# Patient Record
Sex: Female | Born: 1965 | Race: White | Hispanic: No | Marital: Married | Smoking: Never smoker
Health system: Southern US, Community
[De-identification: ages and names within clinical notes are randomized; demographics above are authoritative.]

---

## 2002-11-14 ENCOUNTER — Other Ambulatory Visit: Admission: RE | Admit: 2002-11-14 | Discharge: 2002-11-14 | Payer: Self-pay | Admitting: Obstetrics and Gynecology

## 2005-03-14 ENCOUNTER — Other Ambulatory Visit: Admission: RE | Admit: 2005-03-14 | Discharge: 2005-03-14 | Payer: Self-pay | Admitting: Obstetrics and Gynecology

## 2013-10-04 ENCOUNTER — Ambulatory Visit: Payer: Self-pay | Admitting: Podiatrist

## 2013-10-25 ENCOUNTER — Encounter: Payer: Self-pay | Admitting: Podiatrist

## 2013-10-25 ENCOUNTER — Ambulatory Visit (INDEPENDENT_AMBULATORY_CARE_PROVIDER_SITE_OTHER): Payer: Managed Care, Other (non HMO)

## 2013-10-25 ENCOUNTER — Ambulatory Visit (INDEPENDENT_AMBULATORY_CARE_PROVIDER_SITE_OTHER): Payer: Managed Care, Other (non HMO) | Admitting: Podiatrist

## 2013-10-25 VITALS — BP 105/71 | HR 80 | Resp 18

## 2013-10-25 DIAGNOSIS — M76829 Posterior tibial tendinitis, unspecified leg: Secondary | ICD-10-CM

## 2013-10-25 DIAGNOSIS — R52 Pain, unspecified: Secondary | ICD-10-CM

## 2013-10-25 NOTE — Progress Notes (Signed)
   Subjective:    Patient ID: Brittany Delgado, female    DOB: [birth year], 48 y.o.   MRN: [id number]  HPI MY FEET ARE HURTING AND I RUN ABOUT 2 DAYS A WEEK AND HURT ON THE BALLS OF BOTH FEET AND THEY BURN AND THROB AND I GOT A BRACE TO WEAR AND HURTS IF I DO SOMETHING FOR A LONG PERIOD OF TIME    Review of Systems  All other systems reviewed and are negative.      Objective:   Physical Exam  Patient is awake, alert, and oriented x 3.  In no acute distress.  Vascular status is intact with palpable pedal pulses at 2/4 DP and PT bilateral and capillary refill time within normal limits. Neurological sensation is also intact bilaterally via Semmes Weinstein monofilament at 5/5 sites. Light touch, vibratory sensation, Achilles tendon reflex is intact. Dermatological exam reveals skin color, turger and texture as normal. No open lesions present.  Musculature intact with dorsiflexion, plantarflexion, inversion, eversion.  Forefoot pain is noted bilaterally. Mild tenderness along the posterior tibial tendon and at its insertion is noted.    Assessment & Plan:  Assessment: Posterior tibial tendinitis   Plan: Recommended orthotics to better align and position her foot to keep the posterior tibial tendon from having so much stress put on it. We'll cast her orthotics and will call in ready for pick up..Marland Kitchen

## 2013-10-25 NOTE — Patient Instructions (Signed)
I believe you would benefit from some orthotics to fit in your running shoes to help even out the pressure and biomechanical load on your feet-- we will check to see if inserts are covered by your insurance and will have you casted for the custom orthotics for your feet.

## 2013-11-09 ENCOUNTER — Other Ambulatory Visit: Payer: Managed Care, Other (non HMO)

## 2015-05-17 ENCOUNTER — Encounter: Payer: Self-pay | Admitting: Sports Medicine

## 2015-05-17 ENCOUNTER — Ambulatory Visit (INDEPENDENT_AMBULATORY_CARE_PROVIDER_SITE_OTHER): Payer: Managed Care, Other (non HMO)

## 2015-05-17 ENCOUNTER — Ambulatory Visit (INDEPENDENT_AMBULATORY_CARE_PROVIDER_SITE_OTHER): Payer: Managed Care, Other (non HMO) | Admitting: Sports Medicine

## 2015-05-17 DIAGNOSIS — M722 Plantar fascial fibromatosis: Secondary | ICD-10-CM

## 2015-05-17 DIAGNOSIS — M79673 Pain in unspecified foot: Secondary | ICD-10-CM | POA: Diagnosis not present

## 2015-05-17 MED ORDER — DICLOFENAC SODIUM 75 MG PO TBEC: 75.0000 mg | DELAYED_RELEASE_TABLET | Freq: Two times a day (BID) | ORAL | Status: DC

## 2015-05-17 NOTE — Progress Notes (Signed)
Patient ID: Brittany Delgado, female   DOB: 07-Mar-1966, 50 y.o.   MRN: [id number]   Subjective: Brittany Delgado is a 50 y.o. female patient presents to office with complaint of Bilateral foot pain. Patient  States that her feet hurt all over; her feet generally feel "bruised"; hurts on the bottom often and on the top sometimes; states that she exercises from Sunday through Thursday and typically afterwards, her feet are really sore. States that she hasn't done anything different in her exercise routine; reports that around Christmas time She got new shoes and has been using those and is concerned if her shoes are aggravating her feet. She states that when she does wear Flat shoes the pain feels worse; Admits pain is most intense morning first rising out of bed versus the afternoon; states that she has tried compression sock and ice that helps. Admits that She doesn't take the time to ice like she should even though it really helps a lot with her pain and discomfort.  Denies any other pedal complaints at this time.  There are no active problems to display for this patient.  No current outpatient prescriptions on file prior to visit.   No current facility-administered medications on file prior to visit.   Allergies  Allergen Reactions  . Fexofenadine Other (See Comments)    headaches  . Mirtazapine Other (See Comments)    hypersomnolence  . Paroxetine Other (See Comments)    Insomnia  . Penicillins   . Prednisone Other (See Comments)    Irritability    Objective: Physical Exam General: The patient is alert and oriented x3 in no acute distress.  Dermatology: Skin is warm, dry and supple bilateral lower extremities. Nails 1-10 are normal. There is no erythema, edema, no eccymosis, no open lesions present. Integument is otherwise unremarkable.  Vascular: Dorsalis Pedis pulse and Posterior Tibial pulse are 2/4 bilateral. Capillary fill time is immediate to all digits.  Neurological:  Grossly intact to light touch with an achilles reflex of +2/5 and a negative Tinel's sign bilateral.  Musculoskeletal: Tenderness to palpation at the medial calcaneal tubercale and through the insertion of the plantar fascia and arch on both feet. No pain with compression of calcaneus bilateral. No pain with tuning fork to calcaneus bilateral. No pain with calf compression bilateral. Ankle and pedal joints range of motion within normal limits bilateral. Strength 5/5 in all groups bilateral.   Xray, Right & Left foot: Normal osseous mineralization. Joint spaces preserved. No fracture/dislocation/boney destruction. Mild hammertoe deformity. Left >Right Calcaneal spur present with mild thickening of plantar fascia. No other soft tissue abnormalities or radiopaque foreign bodies.   Assessment and Plan: Problem List Items Addressed This Visit    None    Visit Diagnoses    Foot pain, unspecified laterality    -  Primary    Relevant Medications    diclofenac (VOLTAREN) 75 MG EC tablet    Other Relevant Orders    DG Foot 2 Views Left    DG Foot 2 Views Right    Plantar fasciitis, bilateral        Relevant Medications    diclofenac (VOLTAREN) 75 MG EC tablet       -Complete examination performed. Discussed with patient in detail the condition of plantar fasciitis, how this occurs and general treatment options. Explained both conservative and surgical treatments.  -No injection given due to Patient intolerability to steroids -Rx Diclofenac -Recommended good supportive shoes and advised use of OTC insert. Explained to  patient that if these orthoses work well, we will continue with these. If these do not improve her condition and Pain and she gets relief with fascial brace, we will consider custom molded orthoses. - Explained in detail the use of the fascial brace which was dispensed at today's visit. -Explained and dispensed to patient daily stretching exercises. -Recommend patient to ice affected  area 1-2x daily. -Patient to return to office in 3 weeks for follow up or sooner if problems or questions arise.  Asencion Islam, DPM

## 2015-05-17 NOTE — Patient Instructions (Signed)

## 2015-05-17 NOTE — Progress Notes (Deleted)
   Subjective:    Patient ID: Brittany Delgado, female    DOB: 1965/07/10, 50 y.o.   MRN: [id number]  HPI    Review of Systems  All other systems reviewed and are negative.      Objective:   Physical Exam        Assessment & Plan:

## 2015-06-07 ENCOUNTER — Ambulatory Visit: Payer: Managed Care, Other (non HMO) | Admitting: Sports Medicine

## 2015-09-27 ENCOUNTER — Ambulatory Visit: Payer: Managed Care, Other (non HMO) | Admitting: Sports Medicine

## 2017-01-02 ENCOUNTER — Ambulatory Visit (INDEPENDENT_AMBULATORY_CARE_PROVIDER_SITE_OTHER): Payer: 59 | Admitting: Sports Medicine

## 2017-01-02 DIAGNOSIS — M722 Plantar fascial fibromatosis: Secondary | ICD-10-CM

## 2017-01-02 DIAGNOSIS — M79672 Pain in left foot: Secondary | ICD-10-CM

## 2017-01-02 DIAGNOSIS — M79671 Pain in right foot: Secondary | ICD-10-CM

## 2017-01-02 MED ORDER — DICLOFENAC SODIUM 75 MG PO TBEC: 75.0000 mg | DELAYED_RELEASE_TABLET | Freq: Two times a day (BID) | ORAL | 0 refills | Status: DC

## 2017-01-02 NOTE — Progress Notes (Signed)
Patient ID: Brittany Delgado, female   DOB: 23-Jun-1965, 51 y.o.   MRN: [id number]   Subjective: Brittany Delgado is a 51 y.o. female patient presents to office with complaint of Bilateral foot pain. Patient  States that the pain is the same and that she never got the medicine that I recommended earlier this year. Patient states that pain is approximately 8-9 out of 10. Every day. States that it is difficult to describe the pain generally. Her feet feel bruise and just hurt all over the bottom. States that she's been icing and using over-the-counter insoles changing shoes and has been using night splint with no complete relief. States that icing does help, however, she still consistently has pain with first few steps out of bed in the morning and every evening and sometimes after exercise.  Denies any other pedal complaints at this time.  There are no active problems to display for this patient.  Current Outpatient Prescriptions on File Prior to Visit  Medication Sig Dispense Refill  . albuterol (PROVENTIL) (2.5 MG/3ML) 0.083% nebulizer solution 2.5 mg.    . amitriptyline (ELAVIL) 10 MG tablet     . calcium-vitamin D (CALCIUM 500/D) 500-200 MG-UNIT tablet Take by mouth.    Marland Kitchen amitriptyline (ELAVIL) 25 MG tablet Take 25 mg by mouth.    Marland Kitchen PROAIR HFA 108 (90 Base) MCG/ACT inhaler     . progesterone (PROMETRIUM) 100 MG capsule Take 200 mg by mouth.    . tretinoin (RETIN-A) 0.025 % cream Apply topically.    . tretinoin (RETIN-A) 0.025 % cream     . triamcinolone (KENALOG) 0.025 % cream Apply topically.    . zinc gluconate 50 MG tablet Take 50 mg by mouth.     No current facility-administered medications on file prior to visit.    Allergies  Allergen Reactions  . Fexofenadine Other (See Comments)    headaches  . Mirtazapine Other (See Comments)    hypersomnolence  . Paroxetine Other (See Comments)    Insomnia  . Penicillins   . Prednisone Other (See Comments)    Irritability     Objective: Physical Exam General: The patient is alert and oriented x3 in no acute distress.  Dermatology: Skin is warm, dry and supple bilateral lower extremities. Nails 1-10 are normal. There is no erythema, edema, no eccymosis, no open lesions present. Integument is otherwise unremarkable.  Vascular: Dorsalis Pedis pulse and Posterior Tibial pulse are 2/4 bilateral. Capillary fill time is immediate to all digits.  Neurological: Grossly intact to light touch with an achilles reflex of +2/5 and a negative Tinel's sign bilateral.  Musculoskeletal: Tenderness to palpation at the medial calcaneal tubercale and through the insertion of the plantar fascia and arch on both feet. No pain with compression of calcaneus bilateral. No pain with tuning fork to calcaneus bilateral. No pain with calf compression bilateral. Ankle and pedal joints range of motion within normal limits bilateral. Strength 5/5 in all groups bilateral.   Assessment and Plan: Problem List Items Addressed This Visit    None    Visit Diagnoses    Plantar fasciitis, bilateral    -  Primary   Relevant Medications   diclofenac (VOLTAREN) 75 MG EC tablet   Foot pain, bilateral       Relevant Medications   diclofenac (VOLTAREN) 75 MG EC tablet      -Complete examination performed. Re-Discussed with patient in detail the condition of plantar fasciitis, how this occurs and general treatment options. Explained both conservative  and surgical treatments.  -Rx Diclofenac -Applied plantar fascial strapping bilateral  -Continue with daily stretching exercises. -Continue to ice affected area 1-2x daily. -Patient to return to office in 3 weeks for follow up or sooner if problems or questions arise. Advised patient that if pain is not better next visit will do steroid injections and or consider PT or EPAT  Asencion Islam, DPM

## 2017-01-02 NOTE — Patient Instructions (Signed)

## 2017-01-16 ENCOUNTER — Telehealth: Payer: Self-pay | Admitting: *Deleted

## 2017-01-16 DIAGNOSIS — M79672 Pain in left foot: Secondary | ICD-10-CM

## 2017-01-16 DIAGNOSIS — M79671 Pain in right foot: Secondary | ICD-10-CM

## 2017-01-16 DIAGNOSIS — M722 Plantar fascial fibromatosis: Secondary | ICD-10-CM

## 2017-01-16 MED ORDER — DICLOFENAC SODIUM 75 MG PO TBEC: 75.0000 mg | DELAYED_RELEASE_TABLET | Freq: Two times a day (BID) | ORAL | 0 refills | Status: DC

## 2017-01-16 NOTE — Telephone Encounter (Signed)
Refill request diclofenac. Dr. Marylene LandStover ordered refill as previously +0additional.

## 2017-01-23 ENCOUNTER — Ambulatory Visit: Payer: 59 | Admitting: Sports Medicine

## 2017-02-06 ENCOUNTER — Telehealth: Payer: Self-pay | Admitting: *Deleted

## 2017-02-06 NOTE — Telephone Encounter (Signed)
Refill request for diclofenac. Dr. Marylene LandStover states pt needs a follow up appt prior to future refills. Return fax denying.

## 2017-04-08 ENCOUNTER — Ambulatory Visit: Payer: 59 | Admitting: Sports Medicine

## 2017-04-17 ENCOUNTER — Encounter: Payer: Self-pay | Admitting: Sports Medicine

## 2017-04-17 ENCOUNTER — Ambulatory Visit: Payer: 59 | Admitting: Sports Medicine

## 2017-04-17 DIAGNOSIS — M79672 Pain in left foot: Secondary | ICD-10-CM

## 2017-04-17 DIAGNOSIS — M79671 Pain in right foot: Secondary | ICD-10-CM

## 2017-04-17 DIAGNOSIS — M722 Plantar fascial fibromatosis: Secondary | ICD-10-CM

## 2017-04-17 MED ORDER — DICLOFENAC SODIUM 75 MG PO TBEC: 75.0000 mg | DELAYED_RELEASE_TABLET | Freq: Two times a day (BID) | ORAL | 0 refills | Status: DC

## 2017-04-17 NOTE — Progress Notes (Signed)
Patient ID: Brittany Delgado, female   DOB: May 06, 1965, 52 y.o.   MRN: [id number]   Subjective: Brittany Delgado is a 52 y.o. female patient presents to office with complaint of Bilateral foot pain. Patient  States that the pain is the still there her feet still feel bruise dand just hurt all over the bottom. States that she's been icing and using over-the-counter insoles changing shoes and has been using night splint with no complete relief, states that sometimes she forgets to stretch but regardless has pain every day. Denies any other pedal complaints at this time.  There are no active problems to display for this patient.  Current Outpatient Medications on File Prior to Visit  Medication Sig Dispense Refill  . albuterol (PROVENTIL) (2.5 MG/3ML) 0.083% nebulizer solution 2.5 mg.    . amitriptyline (ELAVIL) 10 MG tablet     . amitriptyline (ELAVIL) 25 MG tablet Take 25 mg by mouth.    . calcium-vitamin D (CALCIUM 500/D) 500-200 MG-UNIT tablet Take by mouth.    . Estradiol (ELESTRIN) 0.52 MG/0.87 GM (0.06%) GEL     . PROAIR HFA 108 (90 Base) MCG/ACT inhaler     . progesterone (PROMETRIUM) 100 MG capsule Take 200 mg by mouth.    . tretinoin (RETIN-A) 0.025 % cream Apply topically.    . tretinoin (RETIN-A) 0.025 % cream     . triamcinolone (KENALOG) 0.025 % cream Apply topically.    . zinc gluconate 50 MG tablet Take 50 mg by mouth.     No current facility-administered medications on file prior to visit.    Allergies  Allergen Reactions  . Fexofenadine Other (See Comments)    headaches  . Mirtazapine Other (See Comments)    hypersomnolence  . Paroxetine Other (See Comments)    Insomnia  . Penicillins   . Prednisone Other (See Comments)    Irritability    Objective: Physical Exam General: The patient is alert and oriented x3 in no acute distress.  Dermatology: Skin is warm, dry and supple bilateral lower extremities. Nails 1-10 are normal. There is no erythema, edema, no  eccymosis, no open lesions present. Integument is otherwise unremarkable.  Vascular: Dorsalis Pedis pulse and Posterior Tibial pulse are 2/4 bilateral. Capillary fill time is immediate to all digits.  Neurological: Grossly intact to light touch with an achilles reflex of +2/5 and a negative Tinel's sign bilateral.  Musculoskeletal: Tenderness to palpation at the medial calcaneal tubercale and through the insertion of the plantar fascia and arch on both feet. No pain with compression of calcaneus bilateral. No pain with tuning fork to calcaneus bilateral. No pain with calf compression bilateral. Ankle and pedal joints range of motion within normal limits bilateral. Strength 5/5 in all groups bilateral.   Assessment and Plan: Problem List Items Addressed This Visit    None    Visit Diagnoses    Plantar fasciitis, bilateral    -  Primary   Relevant Medications   diclofenac (VOLTAREN) 75 MG EC tablet   Foot pain, bilateral       Relevant Medications   diclofenac (VOLTAREN) 75 MG EC tablet     -Complete examination performed. Re-Discussed with patient in detail the condition of plantar fasciitis, how this occurs and general treatment options. Explained both conservative and surgical treatments.  After oral consent and aseptic prep, injected a mixture containing 1 ml of 2%  plain lidocaine, 1 ml 0.5% plain marcaine, 0.5 ml of kenalog 10 and 0.5 ml of dexamethasone phosphate into  right and left heel without complication. Post-injection care discussed with patient.  -Refilled Diclofenac  -Continue with daily stretching exercises. -Continue to ice affected area 1-2x daily. -Patient to return to office in 4 weeks for follow up or sooner if problems or questions arise.   Asencion Islamitorya Kaelen Brennan, DPM

## 2017-05-15 ENCOUNTER — Ambulatory Visit: Payer: 59 | Admitting: Sports Medicine

## 2018-01-08 ENCOUNTER — Ambulatory Visit: Payer: 59 | Admitting: Sports Medicine

## 2018-02-24 ENCOUNTER — Ambulatory Visit: Payer: 59 | Admitting: Sports Medicine

## 2018-02-24 ENCOUNTER — Encounter: Payer: Self-pay | Admitting: Sports Medicine

## 2018-02-24 DIAGNOSIS — M79672 Pain in left foot: Secondary | ICD-10-CM | POA: Diagnosis not present

## 2018-02-24 DIAGNOSIS — M722 Plantar fascial fibromatosis: Secondary | ICD-10-CM | POA: Diagnosis not present

## 2018-02-24 DIAGNOSIS — M79671 Pain in right foot: Secondary | ICD-10-CM

## 2018-02-24 NOTE — Progress Notes (Signed)
Patient ID: Jamayia Croker, female   DOB: [birth year], 52 y.o.   MRN: [id number]   Subjective: Ece Cumberland is a 52 y.o. female patient presents to office with complaint of Bilateral foot pain. Patient states that the pain is the still there all over the bottoms of both feet especially at heels. Reports pain that radiates to arch and ball as well, Nothing has helped long term and does not recall how much relief that she got from past injections, pain 10/10, + constant pain all the time. Denies any other pedal complaints at this time.  There are no active problems to display for this patient.  Current Outpatient Medications on File Prior to Visit  Medication Sig Dispense Refill  . albuterol (PROVENTIL) (2.5 MG/3ML) 0.083% nebulizer solution 2.5 mg.    . amitriptyline (ELAVIL) 10 MG tablet     . amitriptyline (ELAVIL) 25 MG tablet Take 25 mg by mouth.    . calcium-vitamin D (CALCIUM 500/D) 500-200 MG-UNIT tablet Take by mouth.    . Estradiol (ELESTRIN) 0.52 MG/0.87 GM (0.06%) GEL     . PROAIR HFA 108 (90 Base) MCG/ACT inhaler     . progesterone (PROMETRIUM) 100 MG capsule Take 200 mg by mouth.    . tretinoin (RETIN-A) 0.025 % cream Apply topically.    . tretinoin (RETIN-A) 0.025 % cream     . triamcinolone (KENALOG) 0.025 % cream Apply topically.    . zinc gluconate 50 MG tablet Take 50 mg by mouth.     No current facility-administered medications on file prior to visit.    Allergies  Allergen Reactions  . Fexofenadine Other (See Comments)    headaches  . Mirtazapine Other (See Comments)    hypersomnolence  . Paroxetine Other (See Comments)    Insomnia  . Penicillins   . Prednisone Other (See Comments)    Irritability  . Sulfamethoxazole Hives    Objective: Physical Exam General: The patient is alert and oriented x3 in no acute distress.  Dermatology: Skin is warm, dry and supple bilateral lower extremities. Nails 1-10 are normal. There is no erythema, edema, no eccymosis,  no open lesions present. Integument is otherwise unremarkable.  Vascular: Dorsalis Pedis pulse and Posterior Tibial pulse are 2/4 bilateral. Capillary fill time is immediate to all digits.  Neurological: Grossly intact to light touch with an achilles reflex of +2/5 and a negative Tinel's sign bilateral.  Musculoskeletal: Tenderness to palpation at the medial calcaneal tubercale and through the insertion of the plantar fascia> arch>ball on both feet. No pain with compression of calcaneus bilateral. No pain with tuning fork to calcaneus bilateral. No pain with calf compression bilateral. Ankle and pedal joints range of motion within normal limits bilateral. Strength 5/5 in all groups bilateral.   Assessment and Plan: Problem List Items Addressed This Visit    None    Visit Diagnoses    Plantar fasciitis, bilateral  (Chronic)   -  Primary   Foot pain, bilateral         -Complete examination performed. Re-Discussed with patient in detail the condition of plantar fasciitis, how this occurs and general treatment options. Explained both conservative and surgical treatments.  -Patient elects at this time to try EPAT -Continue with daily stretching exercises. -Continue to ice affected area 1-2x daily and OTC aleve until time for EPAT -Patient to return to office after EPAT or sooner if worsens. After 1st EPAT session Shanda Bumps Q if no better please order Arthritic to further evaluate any other  underlying causes of chronic inflammation.    Asencion Islamitorya Cambryn Charters, DPM

## 2018-03-03 ENCOUNTER — Telehealth: Payer: Self-pay | Admitting: *Deleted

## 2018-03-03 ENCOUNTER — Other Ambulatory Visit: Payer: Self-pay | Admitting: Sports Medicine

## 2018-03-03 DIAGNOSIS — M2550 Pain in unspecified joint: Secondary | ICD-10-CM

## 2018-03-03 NOTE — Progress Notes (Signed)
Arthritic panel order mailed to patient. Patient to go to [facility] to have this panel completed and [facility]. Patient to call for appointment for PT. -Dr. Marylene LandStover

## 2018-03-03 NOTE — Telephone Encounter (Signed)
Patient called and has canceled the EPAT has decided to have you set up PT and would also like to do blood work to see if she has arthritis.

## 2018-03-09 ENCOUNTER — Other Ambulatory Visit: Payer: 59

## 2018-04-21 DIAGNOSIS — M722 Plantar fascial fibromatosis: Secondary | ICD-10-CM | POA: Diagnosis not present

## 2018-04-21 DIAGNOSIS — M25672 Stiffness of left ankle, not elsewhere classified: Secondary | ICD-10-CM | POA: Diagnosis not present

## 2018-04-21 DIAGNOSIS — M799 Soft tissue disorder, unspecified: Secondary | ICD-10-CM | POA: Diagnosis not present

## 2018-04-21 DIAGNOSIS — M25671 Stiffness of right ankle, not elsewhere classified: Secondary | ICD-10-CM | POA: Diagnosis not present

## 2018-04-28 DIAGNOSIS — M799 Soft tissue disorder, unspecified: Secondary | ICD-10-CM | POA: Diagnosis not present

## 2018-04-28 DIAGNOSIS — M25672 Stiffness of left ankle, not elsewhere classified: Secondary | ICD-10-CM | POA: Diagnosis not present

## 2018-04-28 DIAGNOSIS — M722 Plantar fascial fibromatosis: Secondary | ICD-10-CM | POA: Diagnosis not present

## 2018-04-28 DIAGNOSIS — M25671 Stiffness of right ankle, not elsewhere classified: Secondary | ICD-10-CM | POA: Diagnosis not present

## 2018-04-29 DIAGNOSIS — M799 Soft tissue disorder, unspecified: Secondary | ICD-10-CM | POA: Diagnosis not present

## 2018-04-29 DIAGNOSIS — M25672 Stiffness of left ankle, not elsewhere classified: Secondary | ICD-10-CM | POA: Diagnosis not present

## 2018-04-29 DIAGNOSIS — M25671 Stiffness of right ankle, not elsewhere classified: Secondary | ICD-10-CM | POA: Diagnosis not present

## 2018-04-29 DIAGNOSIS — M722 Plantar fascial fibromatosis: Secondary | ICD-10-CM | POA: Diagnosis not present

## 2018-05-04 DIAGNOSIS — M25672 Stiffness of left ankle, not elsewhere classified: Secondary | ICD-10-CM | POA: Diagnosis not present

## 2018-05-04 DIAGNOSIS — M799 Soft tissue disorder, unspecified: Secondary | ICD-10-CM | POA: Diagnosis not present

## 2018-05-04 DIAGNOSIS — M722 Plantar fascial fibromatosis: Secondary | ICD-10-CM | POA: Diagnosis not present

## 2018-05-04 DIAGNOSIS — M25671 Stiffness of right ankle, not elsewhere classified: Secondary | ICD-10-CM | POA: Diagnosis not present

## 2018-05-06 DIAGNOSIS — M799 Soft tissue disorder, unspecified: Secondary | ICD-10-CM | POA: Diagnosis not present

## 2018-05-06 DIAGNOSIS — M25671 Stiffness of right ankle, not elsewhere classified: Secondary | ICD-10-CM | POA: Diagnosis not present

## 2018-05-06 DIAGNOSIS — M25672 Stiffness of left ankle, not elsewhere classified: Secondary | ICD-10-CM | POA: Diagnosis not present

## 2018-05-06 DIAGNOSIS — M722 Plantar fascial fibromatosis: Secondary | ICD-10-CM | POA: Diagnosis not present

## 2018-05-13 DIAGNOSIS — M799 Soft tissue disorder, unspecified: Secondary | ICD-10-CM | POA: Diagnosis not present

## 2018-05-13 DIAGNOSIS — M25672 Stiffness of left ankle, not elsewhere classified: Secondary | ICD-10-CM | POA: Diagnosis not present

## 2018-05-13 DIAGNOSIS — M722 Plantar fascial fibromatosis: Secondary | ICD-10-CM | POA: Diagnosis not present

## 2018-05-13 DIAGNOSIS — M25671 Stiffness of right ankle, not elsewhere classified: Secondary | ICD-10-CM | POA: Diagnosis not present

## 2018-05-20 DIAGNOSIS — M25672 Stiffness of left ankle, not elsewhere classified: Secondary | ICD-10-CM | POA: Diagnosis not present

## 2018-05-20 DIAGNOSIS — M722 Plantar fascial fibromatosis: Secondary | ICD-10-CM | POA: Diagnosis not present

## 2018-05-20 DIAGNOSIS — M25671 Stiffness of right ankle, not elsewhere classified: Secondary | ICD-10-CM | POA: Diagnosis not present

## 2018-05-20 DIAGNOSIS — M799 Soft tissue disorder, unspecified: Secondary | ICD-10-CM | POA: Diagnosis not present

## 2018-06-10 DIAGNOSIS — M799 Soft tissue disorder, unspecified: Secondary | ICD-10-CM | POA: Diagnosis not present

## 2018-06-10 DIAGNOSIS — M722 Plantar fascial fibromatosis: Secondary | ICD-10-CM | POA: Diagnosis not present

## 2018-06-10 DIAGNOSIS — M25671 Stiffness of right ankle, not elsewhere classified: Secondary | ICD-10-CM | POA: Diagnosis not present

## 2018-06-10 DIAGNOSIS — M25672 Stiffness of left ankle, not elsewhere classified: Secondary | ICD-10-CM | POA: Diagnosis not present

## 2019-03-08 DIAGNOSIS — E559 Vitamin D deficiency, unspecified: Secondary | ICD-10-CM | POA: Diagnosis not present

## 2019-03-08 DIAGNOSIS — Z0000 Encounter for general adult medical examination without abnormal findings: Secondary | ICD-10-CM | POA: Diagnosis not present

## 2019-03-08 DIAGNOSIS — Z1211 Encounter for screening for malignant neoplasm of colon: Secondary | ICD-10-CM | POA: Diagnosis not present

## 2019-03-08 DIAGNOSIS — Z131 Encounter for screening for diabetes mellitus: Secondary | ICD-10-CM | POA: Diagnosis not present

## 2019-03-08 DIAGNOSIS — Z1331 Encounter for screening for depression: Secondary | ICD-10-CM | POA: Diagnosis not present

## 2019-03-08 DIAGNOSIS — Z13220 Encounter for screening for lipoid disorders: Secondary | ICD-10-CM | POA: Diagnosis not present

## 2019-05-12 DIAGNOSIS — Z6821 Body mass index (BMI) 21.0-21.9, adult: Secondary | ICD-10-CM | POA: Diagnosis not present

## 2019-05-12 DIAGNOSIS — Z1231 Encounter for screening mammogram for malignant neoplasm of breast: Secondary | ICD-10-CM | POA: Diagnosis not present

## 2019-05-12 DIAGNOSIS — Z01419 Encounter for gynecological examination (general) (routine) without abnormal findings: Secondary | ICD-10-CM | POA: Diagnosis not present

## 2019-09-27 DIAGNOSIS — L709 Acne, unspecified: Secondary | ICD-10-CM | POA: Diagnosis not present

## 2019-09-27 DIAGNOSIS — E559 Vitamin D deficiency, unspecified: Secondary | ICD-10-CM | POA: Diagnosis not present

## 2019-09-27 DIAGNOSIS — F439 Reaction to severe stress, unspecified: Secondary | ICD-10-CM | POA: Diagnosis not present

## 2019-09-27 DIAGNOSIS — L659 Nonscarring hair loss, unspecified: Secondary | ICD-10-CM | POA: Diagnosis not present

## 2019-11-16 DIAGNOSIS — L650 Telogen effluvium: Secondary | ICD-10-CM | POA: Diagnosis not present

## 2020-02-17 DIAGNOSIS — L650 Telogen effluvium: Secondary | ICD-10-CM | POA: Diagnosis not present

## 2020-05-15 DIAGNOSIS — E559 Vitamin D deficiency, unspecified: Secondary | ICD-10-CM | POA: Diagnosis not present

## 2020-05-15 DIAGNOSIS — Z1211 Encounter for screening for malignant neoplasm of colon: Secondary | ICD-10-CM | POA: Diagnosis not present

## 2020-05-15 DIAGNOSIS — Z131 Encounter for screening for diabetes mellitus: Secondary | ICD-10-CM | POA: Diagnosis not present

## 2020-05-15 DIAGNOSIS — Z0000 Encounter for general adult medical examination without abnormal findings: Secondary | ICD-10-CM | POA: Diagnosis not present

## 2020-06-15 DIAGNOSIS — Z1231 Encounter for screening mammogram for malignant neoplasm of breast: Secondary | ICD-10-CM | POA: Diagnosis not present

## 2020-06-15 DIAGNOSIS — Z13820 Encounter for screening for osteoporosis: Secondary | ICD-10-CM | POA: Diagnosis not present

## 2020-06-15 DIAGNOSIS — Z01419 Encounter for gynecological examination (general) (routine) without abnormal findings: Secondary | ICD-10-CM | POA: Diagnosis not present

## 2020-06-15 DIAGNOSIS — Z6821 Body mass index (BMI) 21.0-21.9, adult: Secondary | ICD-10-CM | POA: Diagnosis not present

## 2020-07-12 DIAGNOSIS — M8580 Other specified disorders of bone density and structure, unspecified site: Secondary | ICD-10-CM | POA: Diagnosis not present

## 2020-09-07 DIAGNOSIS — J4530 Mild persistent asthma, uncomplicated: Secondary | ICD-10-CM | POA: Diagnosis not present

## 2020-09-07 DIAGNOSIS — Z6821 Body mass index (BMI) 21.0-21.9, adult: Secondary | ICD-10-CM | POA: Diagnosis not present

## 2020-09-07 DIAGNOSIS — F330 Major depressive disorder, recurrent, mild: Secondary | ICD-10-CM | POA: Diagnosis not present

## 2020-09-07 DIAGNOSIS — Z7689 Persons encountering health services in other specified circumstances: Secondary | ICD-10-CM | POA: Diagnosis not present

## 2020-10-19 DIAGNOSIS — Z1211 Encounter for screening for malignant neoplasm of colon: Secondary | ICD-10-CM | POA: Diagnosis not present

## 2021-01-10 DIAGNOSIS — Z6821 Body mass index (BMI) 21.0-21.9, adult: Secondary | ICD-10-CM | POA: Diagnosis not present

## 2021-01-10 DIAGNOSIS — M7701 Medial epicondylitis, right elbow: Secondary | ICD-10-CM | POA: Diagnosis not present

## 2021-02-18 DIAGNOSIS — Z13220 Encounter for screening for lipoid disorders: Secondary | ICD-10-CM | POA: Diagnosis not present

## 2021-02-18 DIAGNOSIS — M7701 Medial epicondylitis, right elbow: Secondary | ICD-10-CM | POA: Diagnosis not present

## 2021-02-18 DIAGNOSIS — R5383 Other fatigue: Secondary | ICD-10-CM | POA: Diagnosis not present

## 2021-07-15 DIAGNOSIS — J4530 Mild persistent asthma, uncomplicated: Secondary | ICD-10-CM | POA: Diagnosis not present

## 2021-07-15 DIAGNOSIS — R1013 Epigastric pain: Secondary | ICD-10-CM | POA: Diagnosis not present

## 2021-07-15 DIAGNOSIS — Z6821 Body mass index (BMI) 21.0-21.9, adult: Secondary | ICD-10-CM | POA: Diagnosis not present

## 2021-07-15 DIAGNOSIS — F330 Major depressive disorder, recurrent, mild: Secondary | ICD-10-CM | POA: Diagnosis not present

## 2021-07-16 DIAGNOSIS — R1013 Epigastric pain: Secondary | ICD-10-CM | POA: Diagnosis not present

## 2021-07-18 DIAGNOSIS — R1011 Right upper quadrant pain: Secondary | ICD-10-CM | POA: Diagnosis not present

## 2021-07-18 DIAGNOSIS — R1013 Epigastric pain: Secondary | ICD-10-CM | POA: Diagnosis not present

## 2021-08-16 DIAGNOSIS — N951 Menopausal and female climacteric states: Secondary | ICD-10-CM | POA: Diagnosis not present

## 2021-08-16 DIAGNOSIS — Z6822 Body mass index (BMI) 22.0-22.9, adult: Secondary | ICD-10-CM | POA: Diagnosis not present

## 2021-08-16 DIAGNOSIS — G4700 Insomnia, unspecified: Secondary | ICD-10-CM | POA: Diagnosis not present

## 2021-08-16 DIAGNOSIS — Z01419 Encounter for gynecological examination (general) (routine) without abnormal findings: Secondary | ICD-10-CM | POA: Diagnosis not present

## 2021-08-16 DIAGNOSIS — Z1231 Encounter for screening mammogram for malignant neoplasm of breast: Secondary | ICD-10-CM | POA: Diagnosis not present

## 2021-08-19 ENCOUNTER — Other Ambulatory Visit: Payer: Self-pay | Admitting: Obstetrics and Gynecology

## 2021-08-19 DIAGNOSIS — R928 Other abnormal and inconclusive findings on diagnostic imaging of breast: Secondary | ICD-10-CM

## 2021-08-30 ENCOUNTER — Other Ambulatory Visit: Payer: Self-pay

## 2021-09-02 ENCOUNTER — Ambulatory Visit
Admission: RE | Admit: 2021-09-02 | Discharge: 2021-09-02 | Disposition: A | Discharge status: Home / Self Care | Payer: BC Managed Care – PPO | Source: Ambulatory Visit | Attending: Obstetrics and Gynecology | Admitting: Obstetrics and Gynecology

## 2021-09-02 ENCOUNTER — Ambulatory Visit
Admission: RE | Admit: 2021-09-02 | Discharge: 2021-09-02 | Disposition: A | Discharge status: Home / Self Care | Payer: Self-pay | Source: Ambulatory Visit | Attending: Obstetrics and Gynecology | Admitting: Obstetrics and Gynecology

## 2021-09-02 DIAGNOSIS — R928 Other abnormal and inconclusive findings on diagnostic imaging of breast: Secondary | ICD-10-CM

## 2021-09-02 DIAGNOSIS — N6312 Unspecified lump in the right breast, upper inner quadrant: Secondary | ICD-10-CM | POA: Diagnosis not present

## 2021-10-29 DIAGNOSIS — S39012A Strain of muscle, fascia and tendon of lower back, initial encounter: Secondary | ICD-10-CM | POA: Diagnosis not present

## 2021-10-29 DIAGNOSIS — Z6821 Body mass index (BMI) 21.0-21.9, adult: Secondary | ICD-10-CM | POA: Diagnosis not present

## 2022-03-11 DIAGNOSIS — F411 Generalized anxiety disorder: Secondary | ICD-10-CM | POA: Diagnosis not present

## 2022-03-11 DIAGNOSIS — Z780 Asymptomatic menopausal state: Secondary | ICD-10-CM | POA: Diagnosis not present

## 2022-03-11 DIAGNOSIS — Z7689 Persons encountering health services in other specified circumstances: Secondary | ICD-10-CM | POA: Diagnosis not present

## 2022-03-11 DIAGNOSIS — F3342 Major depressive disorder, recurrent, in full remission: Secondary | ICD-10-CM | POA: Diagnosis not present

## 2022-03-11 DIAGNOSIS — J4520 Mild intermittent asthma, uncomplicated: Secondary | ICD-10-CM | POA: Diagnosis not present

## 2022-03-11 DIAGNOSIS — E7800 Pure hypercholesterolemia, unspecified: Secondary | ICD-10-CM | POA: Diagnosis not present

## 2022-09-05 DIAGNOSIS — Z1231 Encounter for screening mammogram for malignant neoplasm of breast: Secondary | ICD-10-CM | POA: Diagnosis not present

## 2022-09-05 DIAGNOSIS — Z6821 Body mass index (BMI) 21.0-21.9, adult: Secondary | ICD-10-CM | POA: Diagnosis not present

## 2022-09-05 DIAGNOSIS — Z01419 Encounter for gynecological examination (general) (routine) without abnormal findings: Secondary | ICD-10-CM | POA: Diagnosis not present

## 2022-09-08 DIAGNOSIS — F411 Generalized anxiety disorder: Secondary | ICD-10-CM | POA: Diagnosis not present

## 2022-09-08 DIAGNOSIS — F5101 Primary insomnia: Secondary | ICD-10-CM | POA: Diagnosis not present

## 2023-08-01 IMAGING — MG MM DIGITAL DIAGNOSTIC UNILAT*R* W/ TOMO W/ CAD
4 series · 4 of 12 positions shown · non-contrast
Comparison: Previous exam(s).

CLINICAL DATA: Patient was recalled from screening mammogram for
possible asymmetry in the right breast.

EXAM:
DIGITAL DIAGNOSTIC UNILATERAL RIGHT MAMMOGRAM WITH TOMOSYNTHESIS AND
CAD; ULTRASOUND RIGHT BREAST LIMITED
TECHNIQUE: Right digital diagnostic mammography and breast tomosynthesis was
performed. The images were evaluated with computer-aided detection.;
Targeted ultrasound examination of the right breast was performed

[R MLO synth-2D]
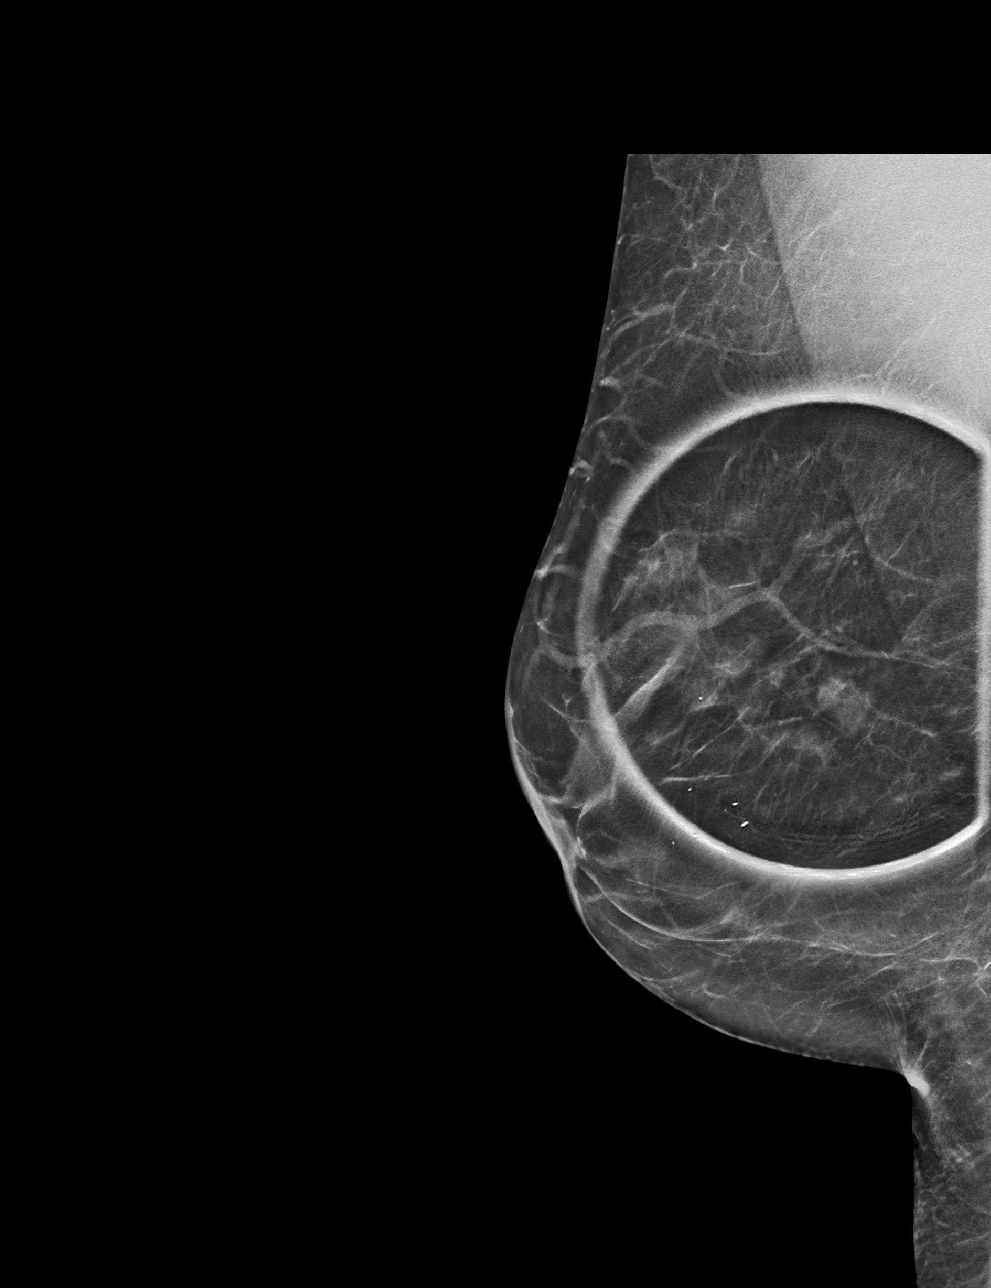

[R CC synth-2D]
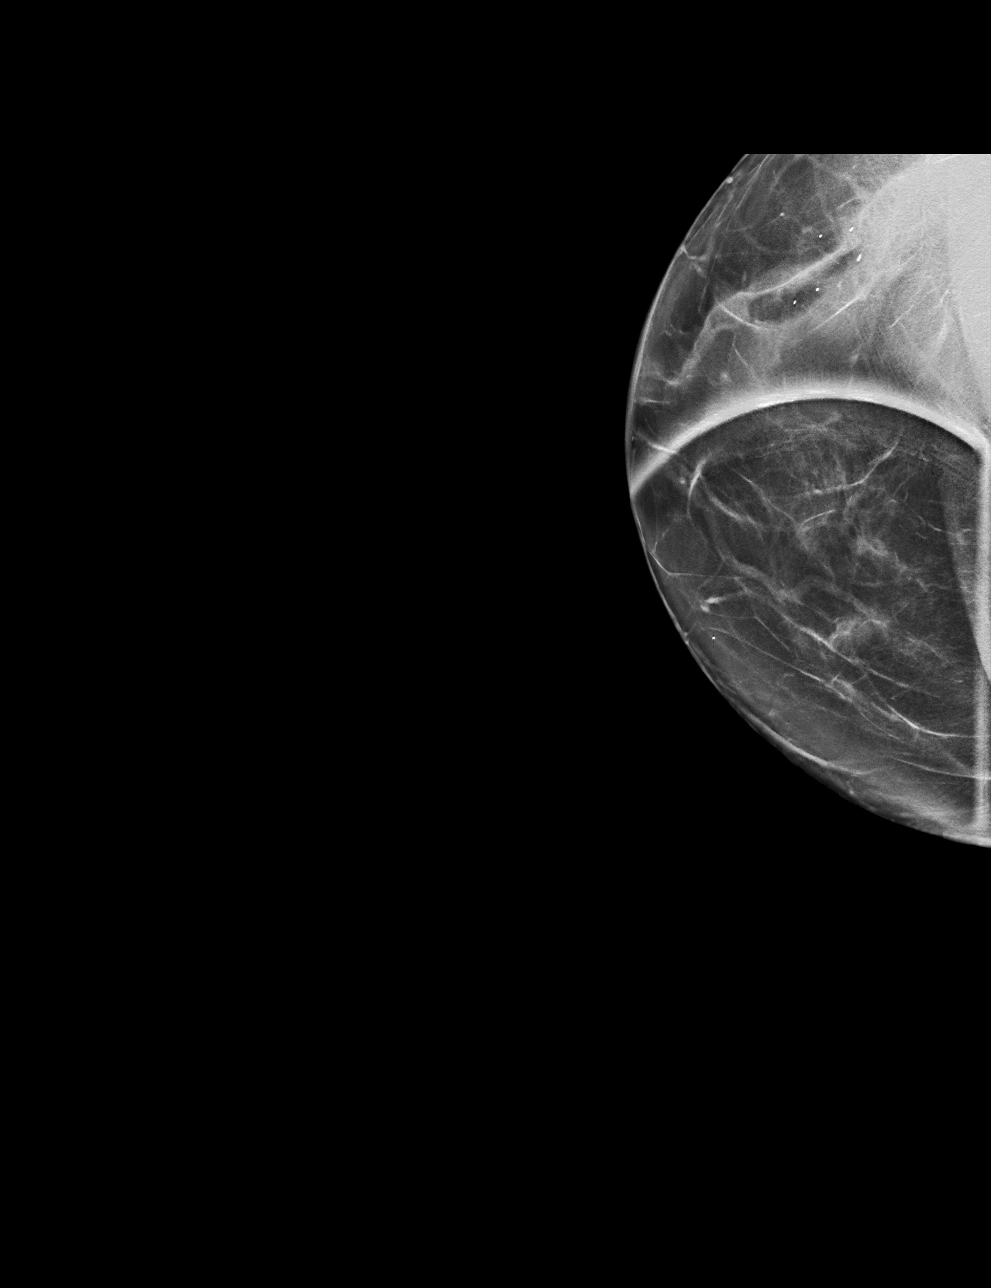

[R CC tomo · tomo slice 30/59.0]
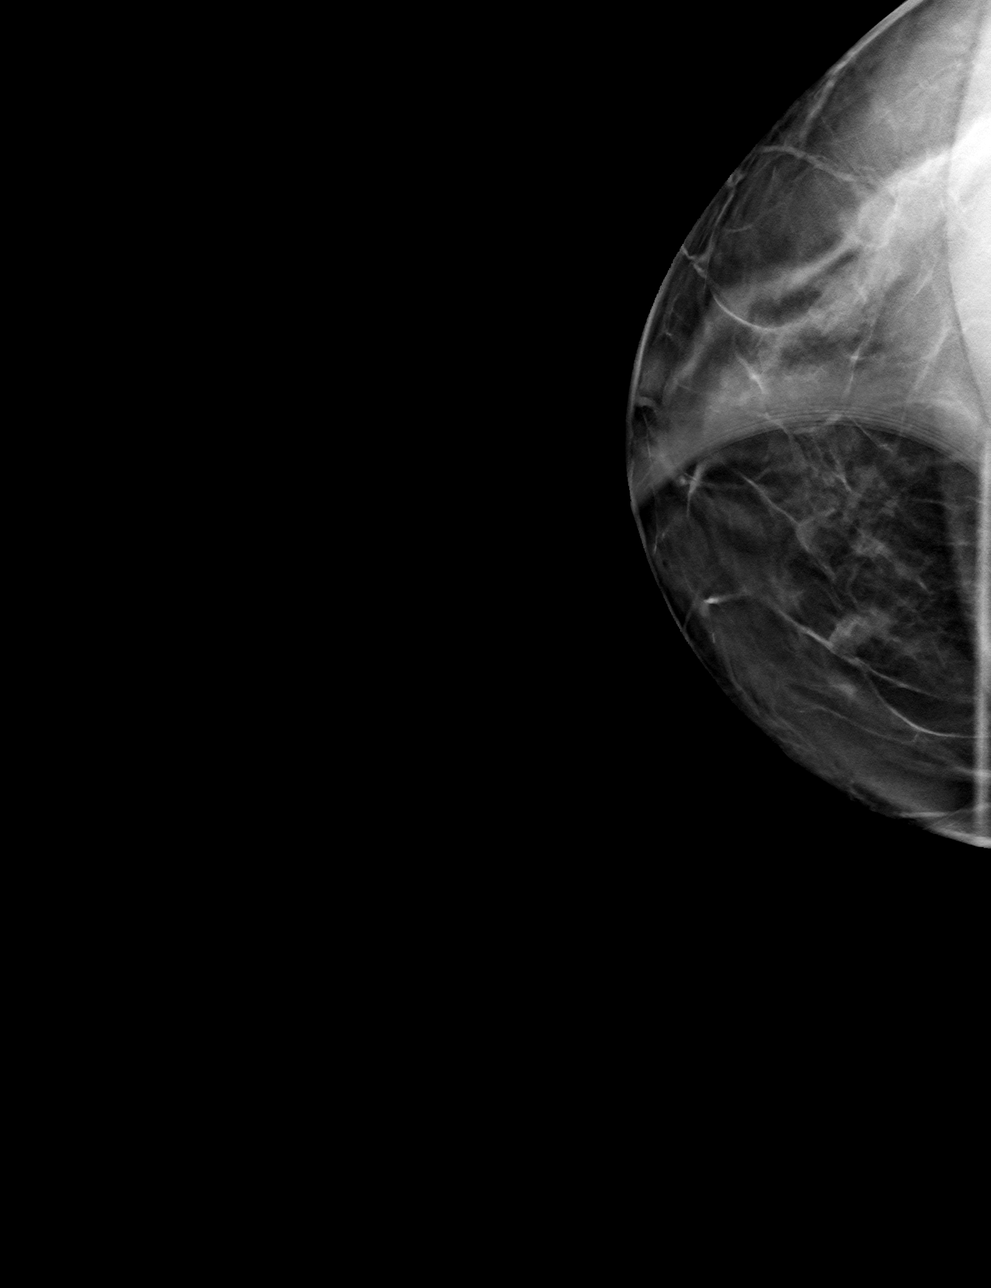

[R MLO tomo · tomo slice 25/49.0]
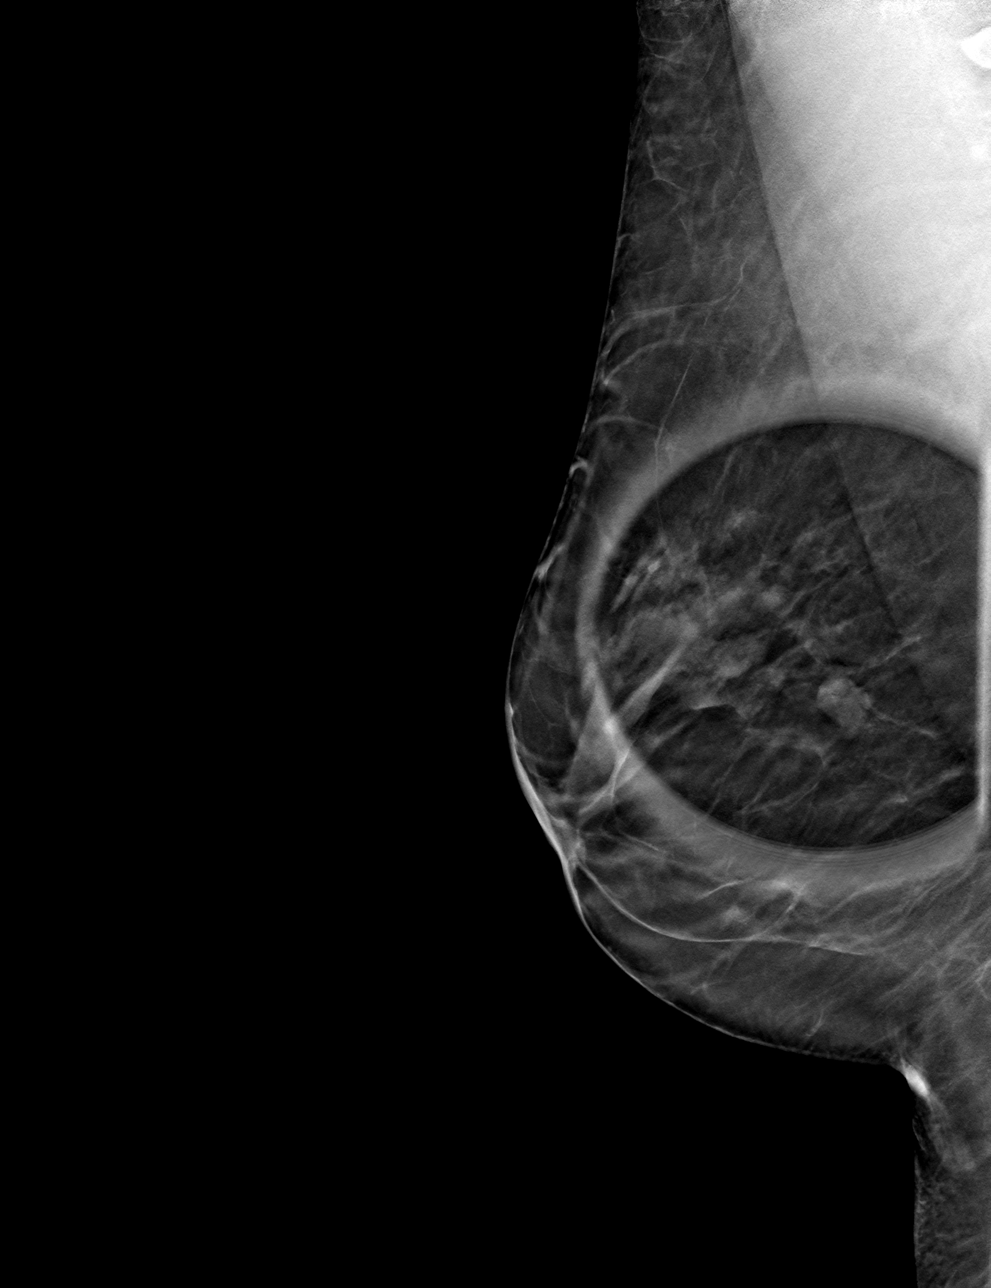

[4 of 12 positions shown; findings below may reference images not displayed]

ACR Breast Density Category b: There are scattered areas of
fibroglandular density.
FINDINGS: Additional imaging of the right breast was performed. The
circumscribed mass in the upper-inner quadrant of the breast is
stable compared to mammograms dating back to 09/04/2015. No new
suspicious mass or malignant type microcalcifications identified.

Targeted ultrasound is performed, showing 2 adjacent circumscribed
masses with increased through transmission in the right breast at 2
o'clock 3 cm from the nipple measuring 3 x 2 x 4 mm and 5 x 2 x 5
mm. These may be complex cysts. They have been mammographically
stable since 9080.
IMPRESSION: No evidence of malignancy in the right breast.

RECOMMENDATION:
Bilateral screening mammogram in 1 year is recommended.

I have discussed the findings and recommendations with the patient.
If applicable, a reminder letter will be sent to the patient
regarding the next appointment.

BI-RADS CATEGORY  2: Benign.

## 2023-08-01 IMAGING — US US BREAST*R* LIMITED INC AXILLA
1 series · 11 of 11 positions shown · non-contrast
Comparison: Previous exam(s).

CLINICAL DATA: Patient was recalled from screening mammogram for
possible asymmetry in the right breast.

EXAM:
DIGITAL DIAGNOSTIC UNILATERAL RIGHT MAMMOGRAM WITH TOMOSYNTHESIS AND
CAD; ULTRASOUND RIGHT BREAST LIMITED
TECHNIQUE: Right digital diagnostic mammography and breast tomosynthesis was
performed. The images were evaluated with computer-aided detection.;
Targeted ultrasound examination of the right breast was performed

[Series 1: us breast*right* limited inc axilla · 0.06mm/px · 11 of 11 slices shown]
[im 1/11]
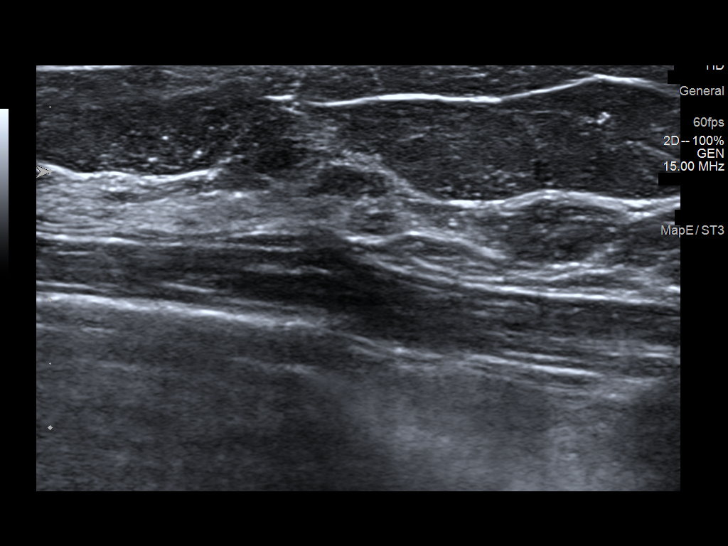
[im 2/11]
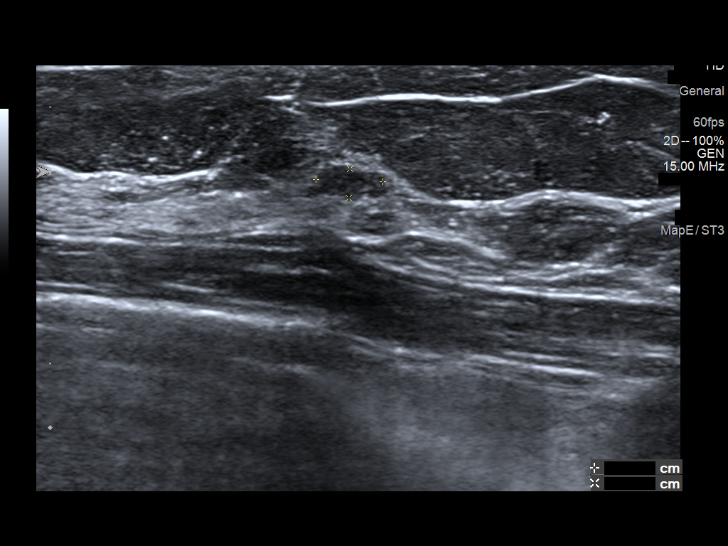
[im 3/11]
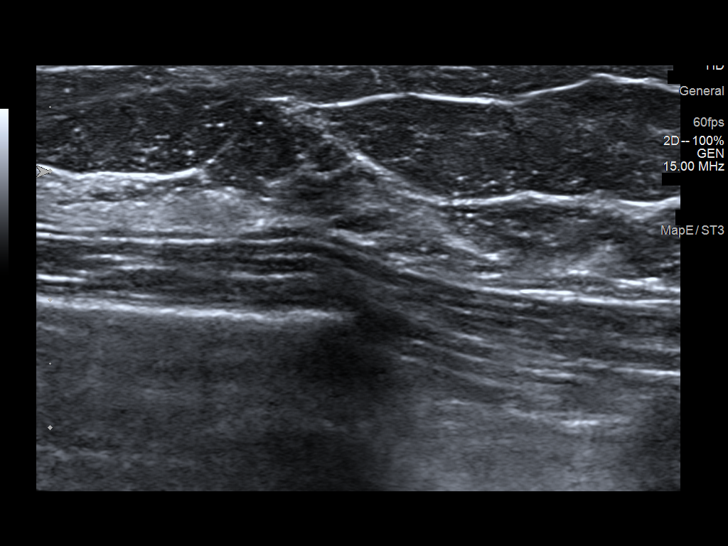
[im 4/11]
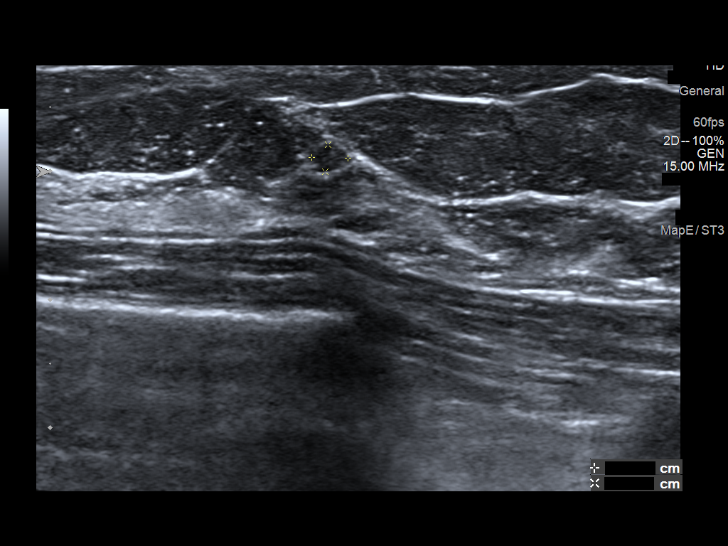
[im 5/11]
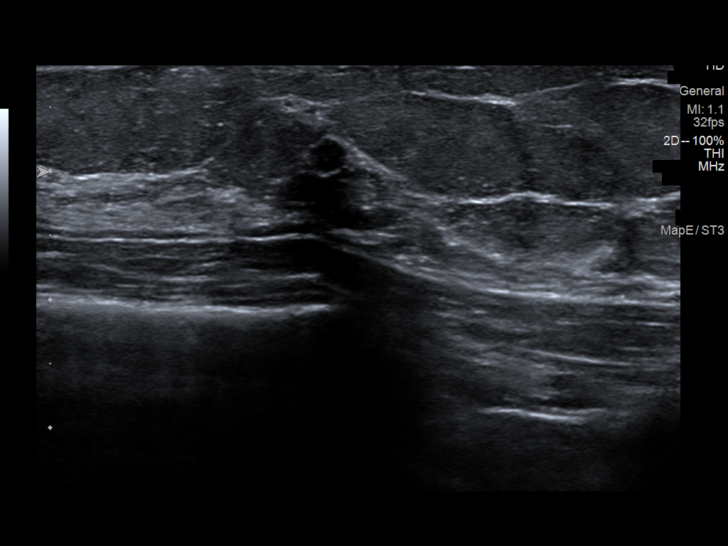
[im 6/11]
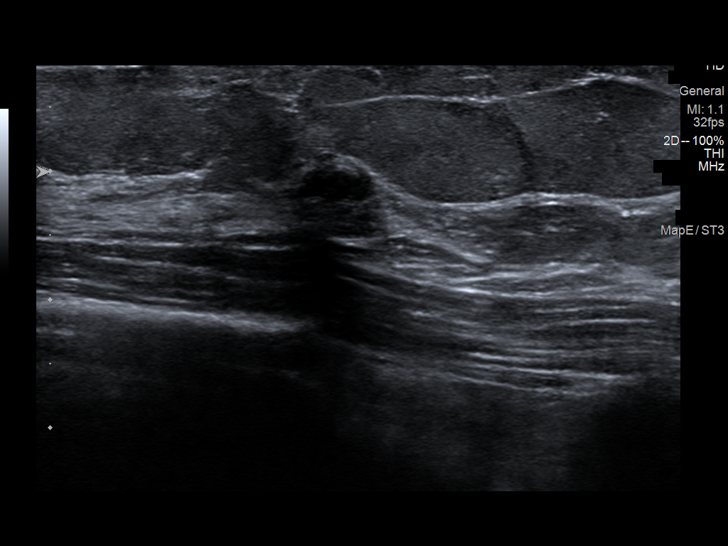
[im 7/11]
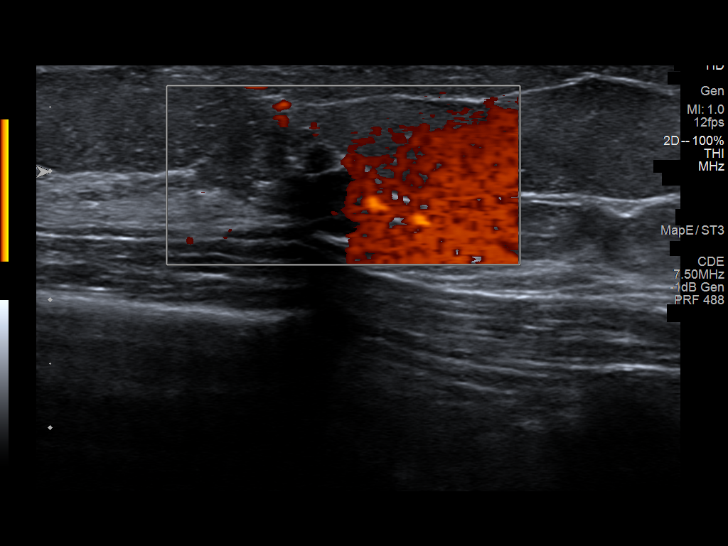
[im 8/11]
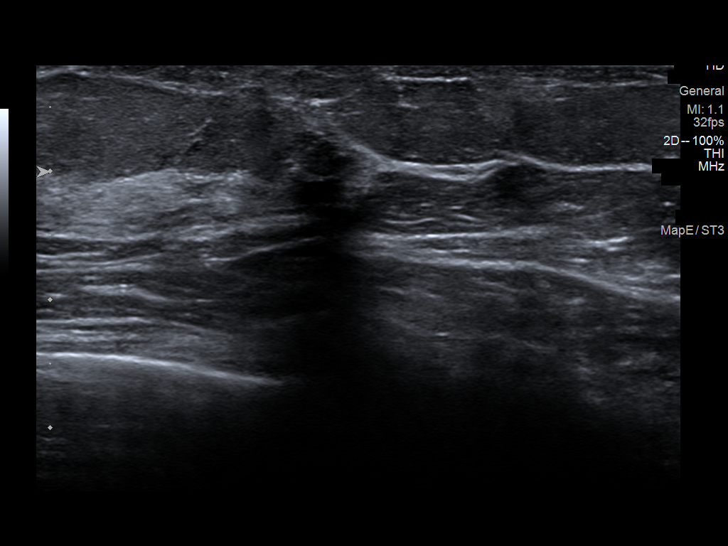
[im 9/11]
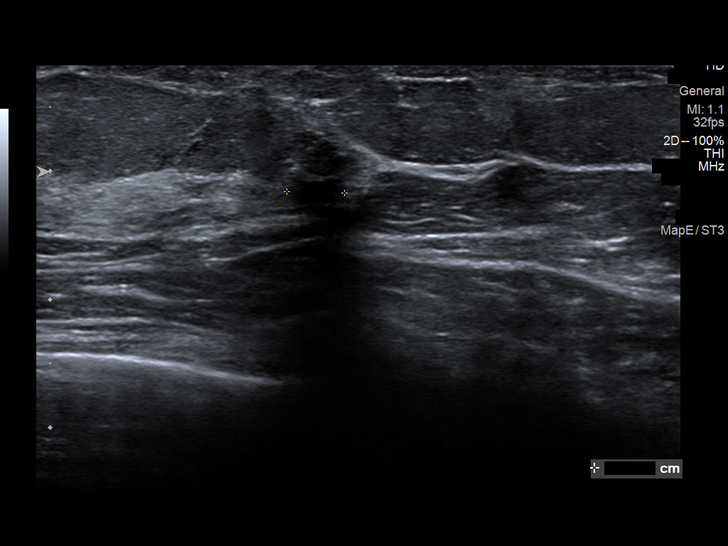
[im 10/11]
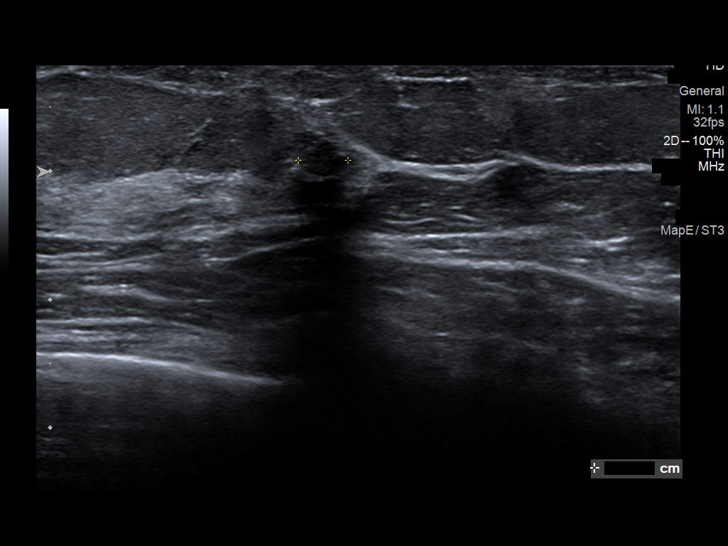
[im 11/11]
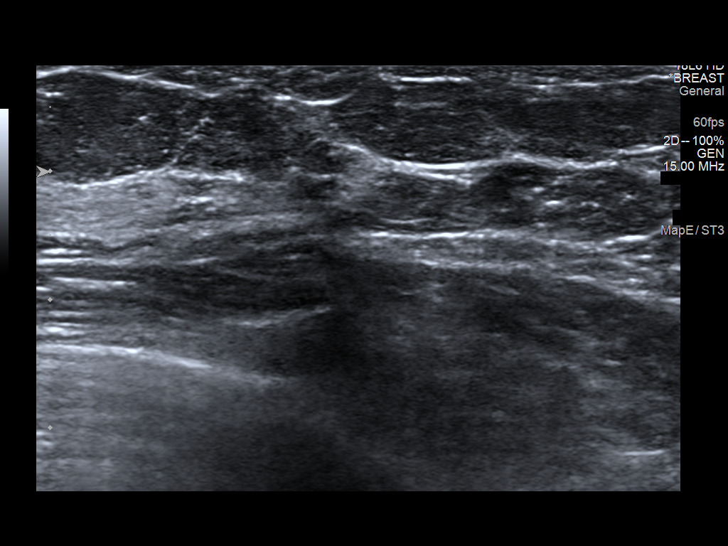

[11 of 11 positions shown; findings below may reference images not displayed]

ACR Breast Density Category b: There are scattered areas of
fibroglandular density.
FINDINGS: Additional imaging of the right breast was performed. The
circumscribed mass in the upper-inner quadrant of the breast is
stable compared to mammograms dating back to 09/04/2015. No new
suspicious mass or malignant type microcalcifications identified.

Targeted ultrasound is performed, showing 2 adjacent circumscribed
masses with increased through transmission in the right breast at 2
o'clock 3 cm from the nipple measuring 3 x 2 x 4 mm and 5 x 2 x 5
mm. These may be complex cysts. They have been mammographically
stable since 9080.
IMPRESSION: No evidence of malignancy in the right breast.

RECOMMENDATION:
Bilateral screening mammogram in 1 year is recommended.

I have discussed the findings and recommendations with the patient.
If applicable, a reminder letter will be sent to the patient
regarding the next appointment.

BI-RADS CATEGORY  2: Benign.
